# Patient Record
Sex: Male | Born: 1955 | Race: White
Health system: Southern US, Community
[De-identification: ages and names within clinical notes are randomized; demographics above are authoritative.]

---

## 2011-12-15 LAB — CBC WITH DIFFERENTIAL/PLATELET
Basophil %: 0.2 %
Eosinophil %: 0.1 %
HCT: 44.8 % (ref 40.0–52.0)
HGB: 15.1 g/dL (ref 13.0–18.0)
Lymphocyte #: 1.5 10*3/uL (ref 1.0–3.6)
MCH: 29.1 pg (ref 26.0–34.0)
MCHC: 33.6 g/dL (ref 32.0–36.0)
Monocyte %: 9.3 %
Neutrophil #: 18.9 10*3/uL — ABNORMAL HIGH (ref 1.4–6.5)
Neutrophil %: 83.7 %
Platelet: 226 10*3/uL (ref 150–440)
RBC: 5.18 10*6/uL (ref 4.40–5.90)
RDW: 14.7 % — ABNORMAL HIGH (ref 11.5–14.5)

## 2011-12-15 LAB — COMPREHENSIVE METABOLIC PANEL
Albumin: 3.9 g/dL (ref 3.4–5.0)
Alkaline Phosphatase: 84 U/L (ref 50–136)
BUN: 11 mg/dL (ref 7–18)
Bilirubin,Total: 1 mg/dL (ref 0.2–1.0)
Chloride: 105 mmol/L (ref 98–107)
EGFR (African American): >60
EGFR (Non-African Amer.): >60
Glucose: 104 mg/dL — ABNORMAL HIGH (ref 65–99)
Osmolality: 277 (ref 275–301)
SGOT(AST): 19 U/L (ref 15–37)
SGPT (ALT): 23 U/L (ref 12–78)

## 2011-12-16 ENCOUNTER — Inpatient Hospital Stay: Payer: Self-pay | Admitting: Internal Medicine

## 2011-12-16 LAB — CBC WITH DIFFERENTIAL/PLATELET
Basophil #: 0.1 10*3/uL (ref 0.0–0.1)
Eosinophil #: 0 10*3/uL (ref 0.0–0.7)
Eosinophil %: 0 %
HGB: 14.6 g/dL (ref 13.0–18.0)
Lymphocyte %: 5.9 %
MCH: 28.9 pg (ref 26.0–34.0)
MCHC: 33.6 g/dL (ref 32.0–36.0)
Monocyte #: 0.8 x10 3/mm (ref 0.2–1.0)
Neutrophil %: 91 %
Platelet: 225 10*3/uL (ref 150–440)
RDW: 14.7 % — ABNORMAL HIGH (ref 11.5–14.5)

## 2011-12-17 LAB — CBC WITH DIFFERENTIAL/PLATELET
Basophil #: 0 10*3/uL (ref 0.0–0.1)
Eosinophil %: 0 %
Lymphocyte #: 1.5 10*3/uL (ref 1.0–3.6)
Lymphocyte %: 6.2 %
MCH: 29.4 pg (ref 26.0–34.0)
MCHC: 33.8 g/dL (ref 32.0–36.0)
Monocyte %: 4.2 %
Neutrophil %: 89.5 %
Platelet: 232 10*3/uL (ref 150–440)
RBC: 4.65 10*6/uL (ref 4.40–5.90)
RDW: 14.4 % (ref 11.5–14.5)

## 2011-12-17 LAB — BASIC METABOLIC PANEL
BUN: 21 mg/dL — ABNORMAL HIGH (ref 7–18)
Chloride: 105 mmol/L (ref 98–107)
Creatinine: 1.13 mg/dL (ref 0.60–1.30)
Osmolality: 291 (ref 275–301)
Potassium: 3.4 mmol/L — ABNORMAL LOW (ref 3.5–5.1)

## 2011-12-17 LAB — VANCOMYCIN, TROUGH: Vancomycin, Trough: 10 ug/mL (ref 10–20)

## 2011-12-20 LAB — CULTURE, BLOOD (SINGLE)

## 2013-12-25 IMAGING — CT CT NECK WITH CONTRAST
1 of 2 series · 9 of 14 positions shown, 12 images · non-contrast
Comparison: none

REASON FOR EXAM: large swelling, pain, L neck - fever
COMMENTS:

[Series 3: soft tissue · axial · 0.48mm/px · z∈[+156,+414]mm · 9 of 108 slices shown, 12 images]
[im 11/108  soft-tissue]
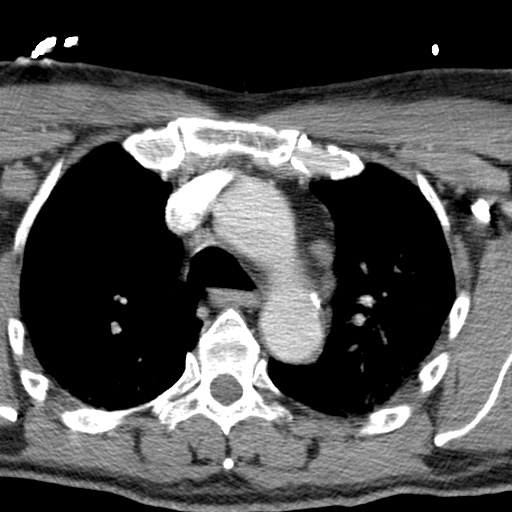
[im 11/108  bone]
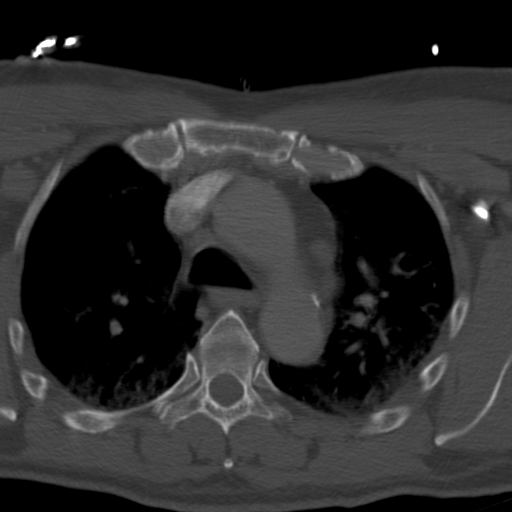
[im 22/108  bone]
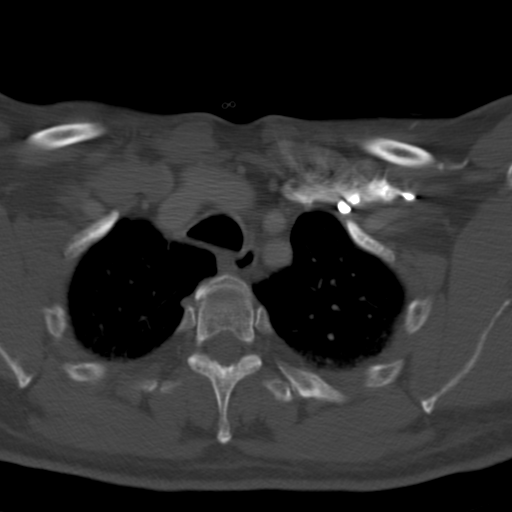
[im 33/108  bone]
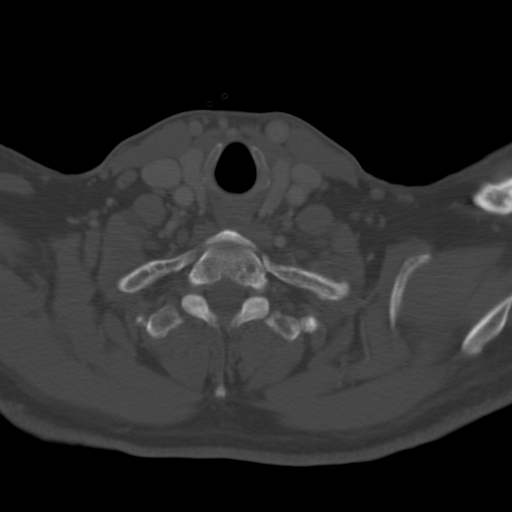
[im 43/108  bone]
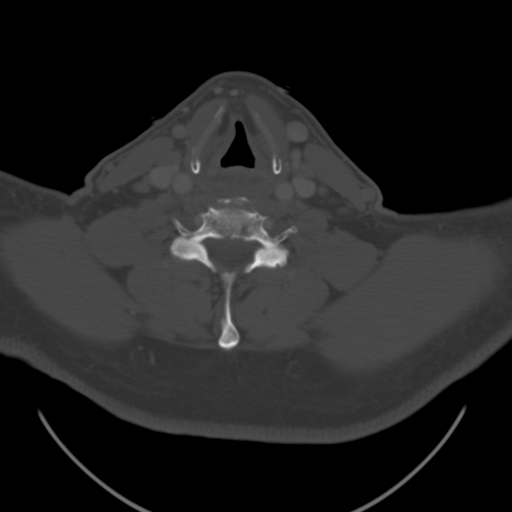
[im 54/108  soft-tissue]
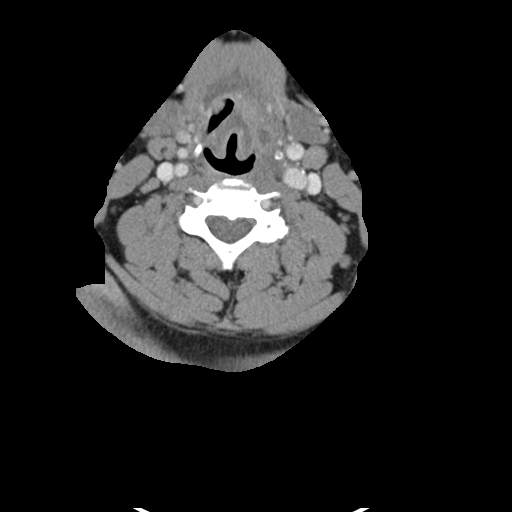
[im 54/108  bone]
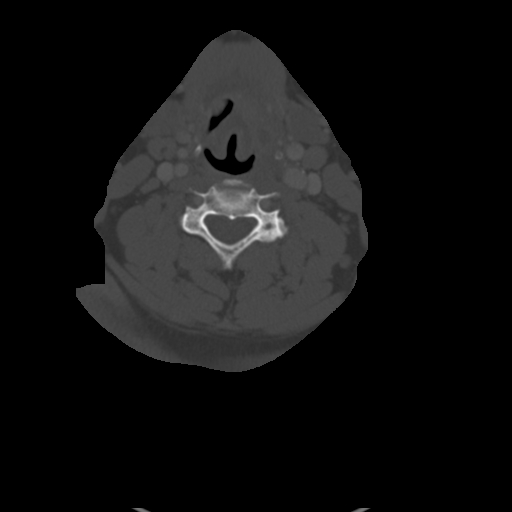
[im 65/108  bone]
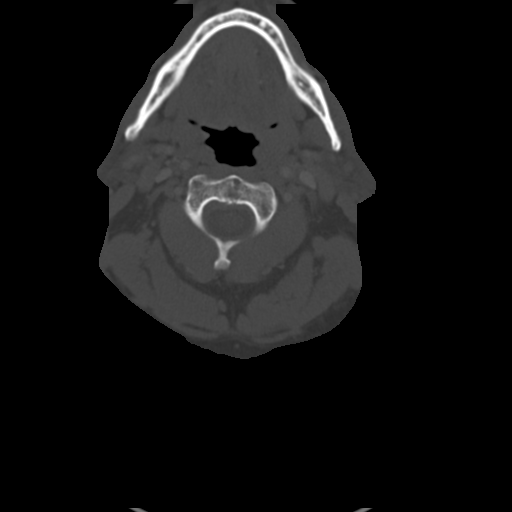
[im 75/108  bone]
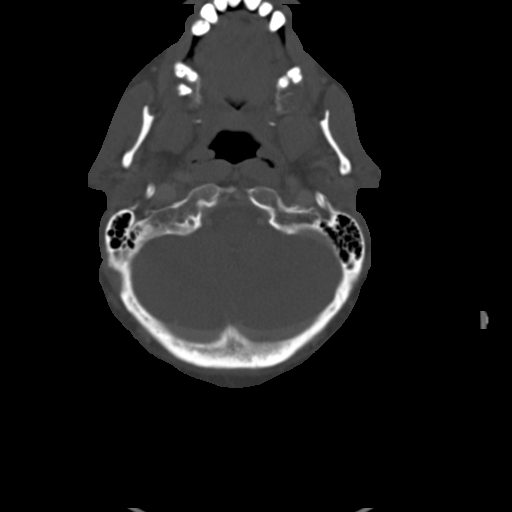
[im 86/108  bone]
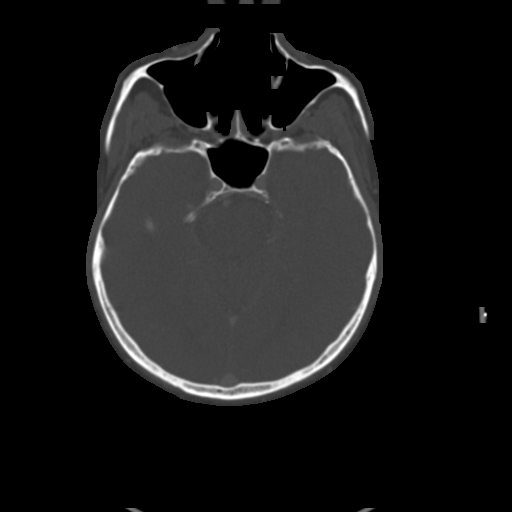
[im 97/108  soft-tissue]
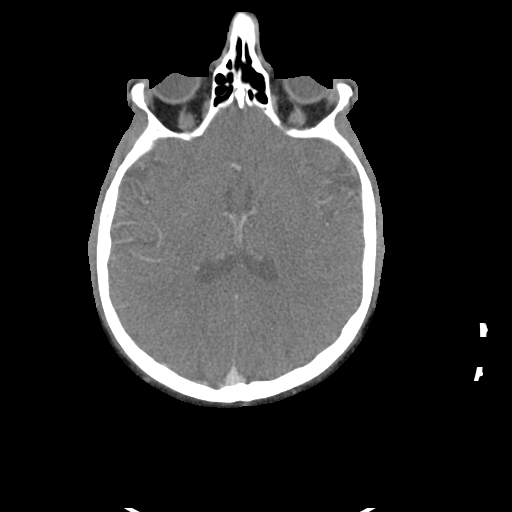
[im 97/108  bone]
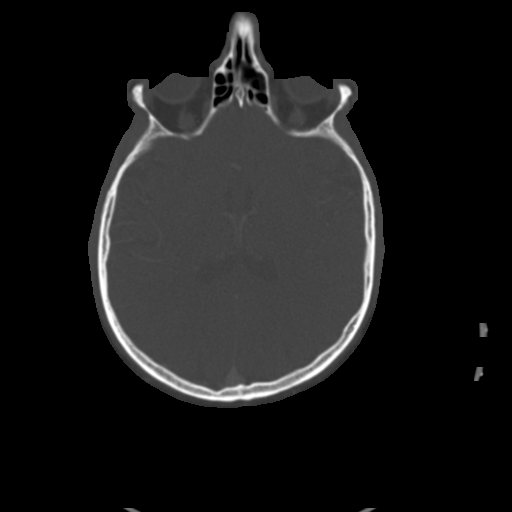

[9 of 14 positions shown; findings below may reference images not displayed]

PROCEDURE:     CT  - CT NECK WITH CONTRAST  - December 16, 2011 [DATE]

RESULT:     CT scanning was performed through the neck with reconstructions
in the axial plane at 3 mm intervals and slice thicknesses following
intravenous administration of 70 cc of Tsovue-TIF. Review of multiplanar
reconstructed images was performed separately on the VIA monitor.

The right and submandibular glands are normal in density and contour. There
are a few normal-sized subcutaneous lymph nodes demonstrated. There are
anterior and posterior cervical nodes measuring up to 1.2 cm in diameter.
Along the base of the tongue to the left of midline there is soft tissue
fullness . This partially effaces the vallecula. There is thickening of the
epiglottis. Inferior to this there is soft tissue fullness posteriorly and
to the right which deforms the airway and lies above the vocal cords. No
soft tissue gas collections are demonstrated. The jugular and carotid
vessels are normal in appearance.

The paranasal sinuses exhibit no air-fluid levels. The nasal passages are
patent. The thyroid lobes are normal in density and fairly symmetric in
size. The thyroid isthmus is mildly prominent measuring 1 cm in AP
dimension. The pulmonary apices exhibit emphysematous changes with small
bullous lesions. The cervical vertebral bodies are preserved in height. The
intravertebral disc space height cerebral reasonably well-maintained. The
prevertebral soft tissue spaces do not appear abnormal.
IMPRESSION: 1. There is thickening of the epiglottis and adjacent aryepiglottic folds
and of the left posterior tongue base. There is also soft tissue fullness
posteriorly and slightly to the right which deforms the airway. The findings
may be infectious or inflammatory or neoplastic though the latter is less
likely. No foreign body is demonstrated. No discrete drainable abscess is
demonstrated.
2. The nasopharyngeal structures and upper oropharyngeal structures appear
normal.
3. The parotid and submandibular glands exhibit no acute abnormality. There
are is no bulky cervical lymphadenopathy but there are borderline enlarged
anterior cervical nodes.

Following stabilization, ENT evaluation is recommended.

A preliminary report was sent to the [HOSPITAL] the conclusion
of the study.

## 2013-12-26 IMAGING — CR DG CHEST 1V PORT
1 series · 1 of 1 positions shown · non-contrast
Comparison: none

REASON FOR EXAM: crackles, hypoxia
COMMENTS:

[portable]
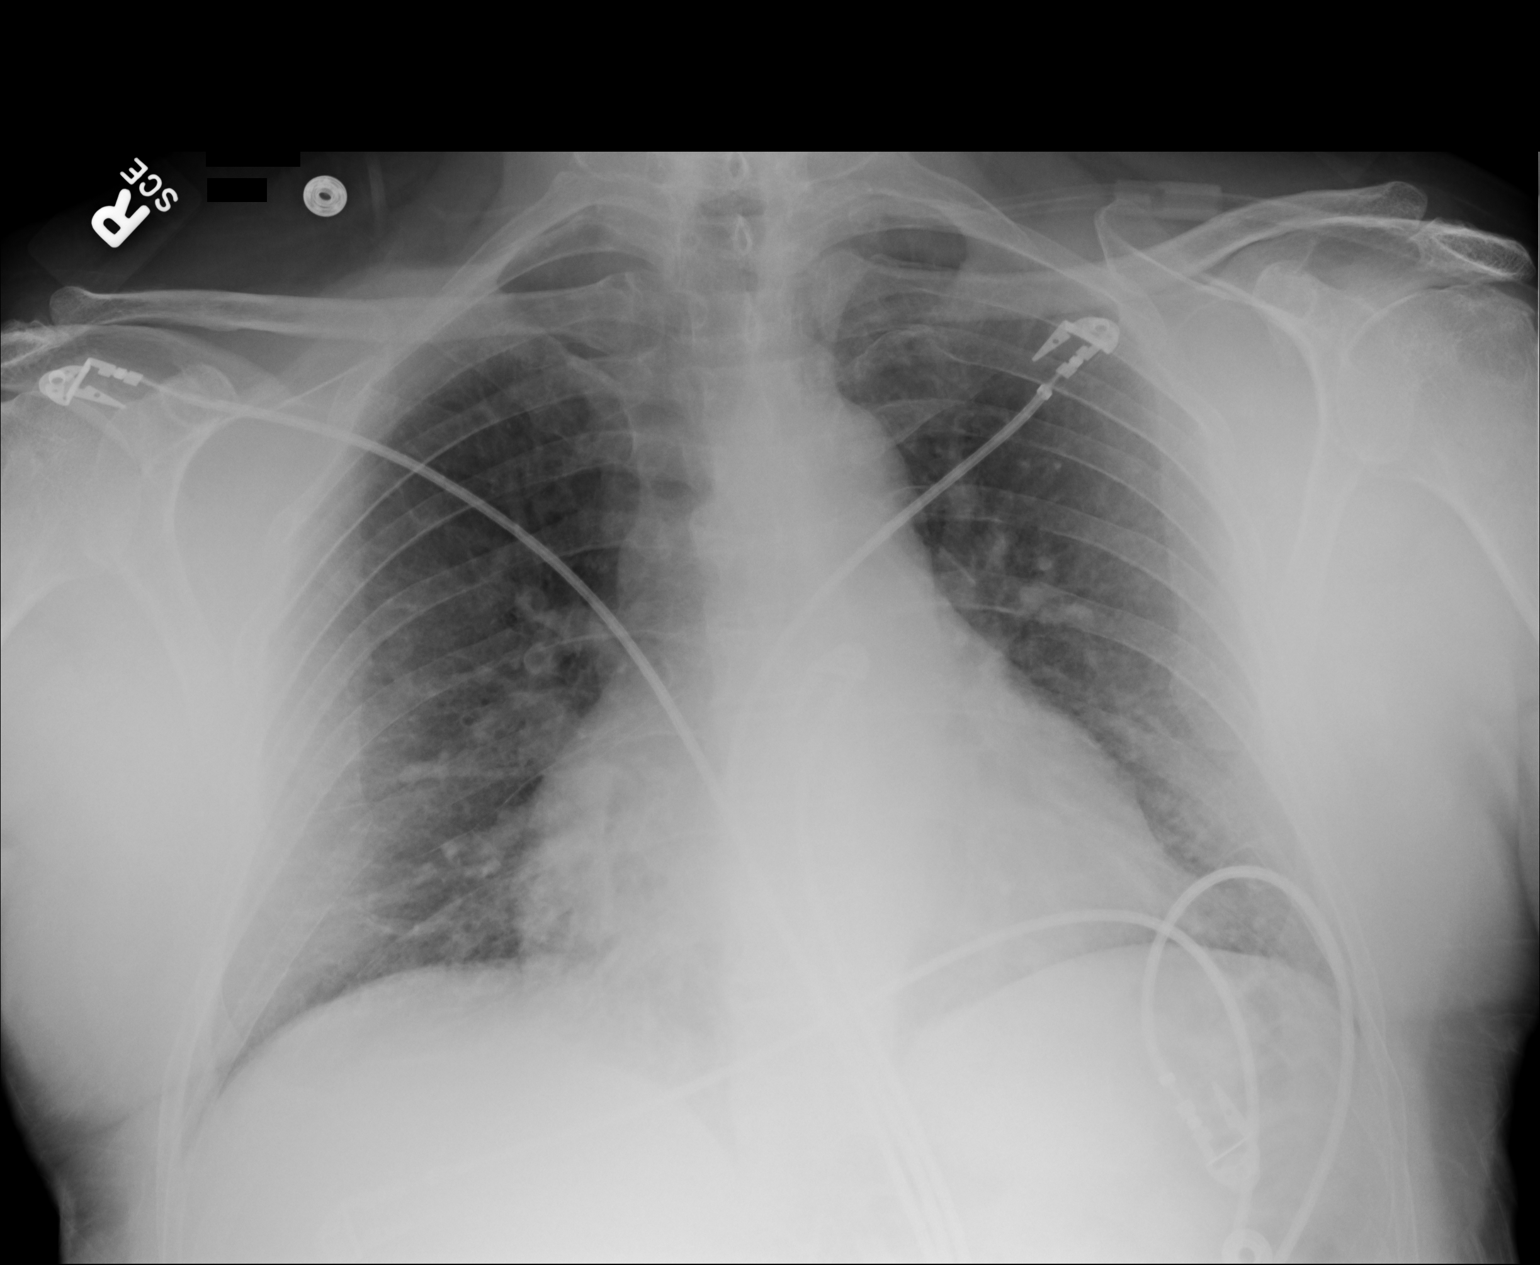

[1 of 1 positions shown; findings below may reference images not displayed]

PROCEDURE:     DXR - DXR PORTABLE CHEST SINGLE VIEW  - December 16, 2011 [DATE]

RESULT:     The patient has taken a shallow inspiration. There is mild
prominence of the interstitial markings. No focal regions of consolidation
are identified. The cardiac silhouette is moderately enlarged. The
visualized bony skeleton is unremarkable.
IMPRESSION: 1.     Shallow inspiration.
2.     Interstitial prominence which may represent a component of pulmonary
edema versus a nonedematous infectious or inflammatory infiltrate.
3.     Surveillance evaluation recommended if and as clinically warranted.

## 2014-08-23 NOTE — H&P (Signed)
PATIENT NAME:  Jerry SkenePARIS, Dinari MR#:  [id number] DATE OF BIRTH:  07/30/1955  DATE OF ADMISSION:  12/16/2011  PRIMARY CARE PHYSICIAN:  None. REFERRING PHYSICIAN: Dr. Manson PasseyBrown   CHIEF COMPLAINT: Fever, chills, sore throat, and dysphagia for three days.   HISTORY OF PRESENT ILLNESS:  59 year old male with no past medical history who presented to the ED with the above chief complaint.  The patient came from [place]; he is visiting his brother. He has been in the [place] for about one year. He started to have a sore throat, fever, and chills about three days ago. In addition, he could not swallow food and had muffled speech but that resolved later. The patient also has a cough and sputum but no shortness of breath or stridor. He denies any other symptoms.  CT scan of the neck showed epiglottitis.  He is being treated with Rocephin and vancomycin. The patient denies any recent travel history, but he mentioned that he suspected he had a tick bite six months ago with a black spot on his right thigh. The patient denies any low-grade fever, weight loss, or sweating recently. Denies any other ill contact.   PAST MEDICAL HISTORY: None.  FAMILY HISTORY: Hypertension in his sister.   PAST SURGICAL HISTORY: Hernia repair.   PERSONAL HISTORY: No smoking. Occasional alcohol. No drugs.  MEDICATIONS:  None.   ALLERGIES: None.  REVIEW OF SYSTEMS: CONSTITUTIONAL: The patient has a fever, chills. No headache or dizziness. No weakness. EYES: No double vision or blurred vision. ENT: No epistaxis or postnasal drainage but has a sore throat, dysphagia, and muffled speech. No drooling.  No discharge from ear or nose. No sinus pain. RESPIRATORY:  Positive for cough, sputum, but no wheezing, stridor, or hemoptysis. No history of tuberculosis. CARDIOVASCULAR: No chest pain, palpitations, orthopnea, or nocturnal dyspnea. No leg edema. GASTROINTESTINAL: No abdominal pain, nausea, vomiting, or diarrhea. No melena or bloody  stool. GENITOURINARY: No dysuria, hematuria, or incontinence. HEMATOLOGIC: No easy bruising or bleeding.  ENDOCRINE: No polyuria, polydipsia, or heat or cold intolerance. NEUROLOGIC: No syncope, loss of consciousness or seizure. PSYCH: No anxiety or depression.   PHYSICAL EXAMINATION:  VITAL SIGNS: Temperature 99.2, temperature maximal was 102.9, blood pressure 140/76, pulse 101. It was 112. Respirations 19, oxygen saturation 93% on oxygen.   GENERAL: The patient is alert, awake, oriented, in no acute distress.   HEENT: Pupils round, equal, reactive to light and accommodation. Moist oral mucosa. Swelling of the oropharynx but no erythema.  No drooling.  NECK: Supple. No JVD or carotid bruits. No lymphadenopathy. No thyromegaly, but has tenderness on the left side of the neck with mild swelling. No stridor. No wheezing.   CARDIOVASCULAR: S1, S2, regular rate and rhythm. No murmurs or gallops.   PULMONARY: Bilateral air entry. No wheezing or rales. No stridor. No use of accessory muscles to breathe.   ABDOMEN: Soft. No distention or tenderness. No organomegaly. Bowel sounds present.   EXTREMITIES: No edema, clubbing, or cyanosis. No calf tenderness. Strong bilateral pedal pulses.   SKIN: No rash or jaundice.   NEUROLOGY: Alert and oriented times three. No focal deficit. Power five out of five. Sensation intact. Deep tendon reflexes 2+.   LABORATORY, DIAGNOSTIC, AND RADIOLOGICAL DATA: WBC 20.5, hemoglobin 15.1, platelets 226, glucose 108, BUN 11, creatinine 0.88. Electrolytes are normal. CT scan of soft tissue of the neck with IV contrast showed thickened epiglottis and aryepiglottic folds compatible with epiglottitis.    IMPRESSION:  1. Acute epiglottitis. 2. SIRS.  PLAN OF TREATMENT:  1. The patient will be admitted to Critical Care Unit.  We will closely monitor airway and vital signs.  Keep n.p.o. now, give IV fluid support. We continue Rocephin and vancomycin. Follow up CBC, blood  culture, and CRP.  2. We will get ENT consult.  3. The patient came from [place].  He has a language barrier and does not speak [place].  All the information gathered by translation from his brother. Discussed the patient's situation and the plan of treatment with the patient and his brother.   TIME SPENT: About 65 minutes.   ____________________________ Shaune Pollack, MD qc:bjt D: 12/16/2011 01:55:58 ET T: 12/16/2011 08:55:49 ET JOB#: [id number]  cc: Shaune Pollack, MD, <Dictator> Shaune Pollack MD ELECTRONICALLY SIGNED 12/17/2011 15:16

## 2014-08-23 NOTE — Op Note (Signed)
PATIENT NAME:  Jerry Torres, Jerry Torres MR#:  [id number] DATE OF BIRTH:  [birth year]  DATE OF PROCEDURE:  12/17/2011  PREOPERATIVE DIAGNOSIS: Epiglottitis.  POSTOPERATIVE DIAGNOSIS:  Epiglottitis.   PROCEDURE:  Flexible fiberoptic nasal laryngoscopy.  SURGEON:  Marion DownerScott Patrik Turnbaugh, MD   INDICATIONS: This is followup on a patient with epiglottitis to assess his airway for improvement. He was able to swallow and eat last night and is feeling better. He says that his sore throat is at least 40% improved. The patient has been afebrile for the past 24 hours.   FINDINGS:  There is still a little thickening in the epiglottitis but the airway is markedly improved and all of the edema of the aryepiglottic fold and arytenoids is essentially resolved. Vocal cords were clear and mobile.  There is some fullness of the lingual tonsillar area notable, but improved from yesterday. There is no airway compromise whatsoever at this point.   DESCRIPTION OF PROCEDURE: After discussing the procedure with the patient, a flexible scope was passed through the right nasal cavity. Nasal cavity, nasopharynx, hypopharynx, larynx, and tongue base were inspected with the above findings. The patient tolerated the procedure well. The scope was removed.   ASSESSMENT: [name] appears to be doing much better at this point and his airway is no longer compromised by edema. There is some mild thickening of the epiglottis at this point which should continue to resolve with antibiotic therapy.   PLAN:  I think the patient is safe to be transferred to the floor from an airway standpoint. When he is ultimately discharged, coverage such as Ceftin or Augmentin could be considered and would also consider sending him home on  60-mg prednisone taper. The one outstanding issue is the patient does have some evidence of respiratory failure requiring oxygen with possible pulmonary edema, so the primary service is proceeding with further work-up of that  situation.    From an ENT standpoint he can be discharged at any time, however. I will sign off.  Feel free to reconsult if necessary.    ____________________________ Jerry GrossPaul S. Willeen CassBennett, MD psb:bjt D: 12/17/2011 08:38:22 ET T: 12/17/2011 11:17:01 ET JOB#: [id number]  cc: Jerry GrossPaul S. Willeen CassBennett, MD, <Dictator> Sandi MealyPAUL S Aviannah Castoro MD ELECTRONICALLY SIGNED 12/18/2011 7:40

## 2014-08-23 NOTE — Discharge Summary (Signed)
PATIENT NAME:  Jerry Torres, Jerry Torres MR#:  [id number] DATE OF BIRTH:  [birth year]  DATE OF ADMISSION:  12/16/2011 DATE OF DISCHARGE:  12/17/2011  ADMITTING DIAGNOSIS: Systemic inflammatory response reaction.   DISCHARGE DIAGNOSES:  1. Systemic inflammatory response reaction due to bacterial acute epiglottitis.  2. Hypoxia.  3. Chronic obstructive pulmonary disease.  4. Ongoing tobacco abuse.  5. Hypertension.  6. Hyperglycemia. Hemoglobin A1c 6.4 concerning for diabetes.   DISCHARGE CONDITION: Stable.   DISCHARGE MEDICATIONS: The patient is to start: 1. Lisinopril 5 mg p.o. daily.  2. Symbicort 160/4.5, two puffs twice daily. 3. Combivent Respimat CFC one puff 4 times daily. 4. Nicotine patch 21 mg topically daily.  5. Nicotine oral inhaler one cartridge inhalation every one hour as needed.  6. Prednisone 50 mg p.o. once on 12/18/2011, then taper by 10 mg daily until stopped.  7. Ceftin 500 mg p.o. twice daily for 10 days.   HOME OXYGEN: None.   DIET: 2 grams salt, carbohydrate controlled diet, mechanical soft, advance to regular as tolerated.   FOLLOWUP: Follow-up appointment with Dr. Willeen Cass in two days after discharge.   DISCHARGE INSTRUCTIONS: The patient was advised to stop smoking as soon as possible.    CONSULTANTS: Dr. Willeen Cass.   PROCEDURE: Flexible fiberoptic nasal laryngoscopy on 12/16/2011. Repeated flexible fiberoptic nasal laryngoscopy again on 12/16/2011. Repeated flexible fiberoptic nasal laryngoscopy on 12/17/2011.   RADIOLOGIC STUDIES: Chest x-ray, portable single view 12/16/2011 revealed shallow inspiration interstitial prominence which may represent a competent of pulmonary edema versus nonedematous infectious or inflammatory infiltrate. Surveillance evaluation is recommended as clinically warranted. CT scan of neck with contrast 12/16/2011 revealed thickening of epiglottis and adjacent aryepiglottic folds of the left posterior tongue base. There is also soft tissue  fullness posteriorly and slightly to the right which deforms the airway. The findings may be infectious or inflammatory or neoplastic, though the latter is less likely. No foreign body is demonstrated. No discrete drainable abscess is demonstrated. The nasopharyngeal structures and upper oropharyngeal structures appear normal. The parotid gland and submandibular glands exhibit no acute abnormality. There is no bulky cervical lymphadenopathy but there are borderline enlarged anterior cervical nodes. CT of chest for pulmonary embolism with IV contrast 12/17/2011 showed no CT evidence of pulmonary embolus. There are severe bilateral emphysematous changes. Lungs otherwise were clear.   HISTORY: The patient is a 59 year old Caucasian male from [place] who presented to the hospital with complaints of fever, chills, sore throat as well as dysphagia for three days prior to coming to the hospital. Please refer to Dr. Nicky Pugh admission note on 12/16/2011.   PHYSICAL EXAMINATION:  On arrival to the hospital, the patient's temperature was 99.2, temperature maximum was 102.9, blood pressure 140/76, pulse 112, respiration rate 19. Oxygen saturation was 93% on oxygen therapy. Physical exam showed mild tenderness of the left side of neck. Mild swelling on palpation, however, no stridor, wheezing, were noted on lung exam. His physical examination was otherwise unremarkable.   LABORATORY DATA: Lab data showed elevated glucose to 104, otherwise BMP was unremarkable. The patient's liver enzymes were normal. The patient's white blood cell count was markedly elevated to 22.5. Hemoglobin 15.1, platelet count 226. Absolute neutrophil count was high at 18.9. Blood cultures x2 did not show any growth taken on 12/16/2011. C-reactive protein was high at 137.6   HOSPITAL COURSE: The patient was evaluated in the Emergency Room and consultation with Dr. Willeen Cass was obtained. Dr. Willeen Cass saw the patient in consultation the same day,  12/16/2011, at 3  a.m. because of odynophagia as well as dysphagia, possible epiglottitis. The patient underwent flexible fiberoptic nasal laryngoscopy which initially revealed erythema as well as edema of the epiglottis extending around the area of epiglottic fold on both sides, a little more prominent on the right than the left where the arytenoid region is a little bit more swollen. Vocal cords were visualized and they were clear and mobile. Tongue base as well as hypopharynx were otherwise unremarkable. The patient was started on antibiotics IV with Rocephin as well as vancomycin. He was started on steroids IV and he was rechecked again in the next nine hours with flexible fiberoptic nasal laryngoscopy. It showed modest improvement from condition last night on the second evaluation although airway remains swollen. Dr. Willeen Torres felt that the patient is best to be kept in Critical Care Unit until at least 12/17/2011. The patient was scoped again on 12/17/2011. His progress was observed and it was felt that there was so little thickening of epiglottis that airway was markedly improved and all the edema in the fold aryepiglottic fold as well as arytenoids was essentially resolved. Vocal cords were clear and mobile. There was some fullness in the lingular tonsillar area notable, but improved from yesterday and there was no airway compromise whatsoever at this point and Dr. Willeen Torres felt that it was safe to transfer the patient to the floor from airways standpoint. He recommended to cover him with Ceftin or Augmentin and also prednisone taper. The patient was followed and it was felt to be safe to be discharged home in regards to acute epiglottitis. However, upon further evaluation, it appeared that the patient still has with improvement of his epiglottis, still has significant hypoxia. The patient's chest x-ray was unremarkable, however, he underwent a CT scan of his chest, which showed severe emphysema, likely due to  prolonged smoking history. The patient was started on Symbicort as well as Combivent. He was advised to quit smoking and nicotine replacement therapy was initiated. The patient was ambulated on room air in the hospital and his oxygen saturation did not drop down below 92% on room air on ambulation   The patient was noted to be hypertensive with systolic blood pressure shooting up to 150s to 160s and lisinopril was initiated. The patient is to follow up with his primary care physician in regards to chronic obstructive pulmonary disease management as well as his hypertension management.   The patient was noted to be hyperglycemic. His Hemoglobin A1c was checked and was found to be 6.4. Because of concern for diabetes, the patient was initiated on carbohydrate-controlled diet and diabetic education was consulted. The patient was advised also to lose weight if possible and to prevent risk of diabetes. The patient is being discharged in stable condition with the above-mentioned medications and follow-up. His vital signs on the day of discharge, temperature 98, pulse 84, respiration rate 16, blood pressure 153/73, saturation 94% on room air at rest.        TIME SPENT: 40 minutes.   ____________________________ Jerry Caperima Jonan Seufert, MD rv:ap D: 12/17/2011 19:11:29 ET T: 12/19/2011 10:55:34 ET JOB#: [id number]  cc: Jerry Caperima Ghina Bittinger, MD, <Dictator> Jerry GrossPaul S. Willeen CassBennett, MD Jerry CaperIMA Terena Bohan MD ELECTRONICALLY SIGNED 12/19/2011 14:49

## 2014-08-23 NOTE — Consult Note (Signed)
PATIENT NAME:  Jerry Torres, Jerry Torres MR#:  [id number] DATE OF BIRTH:  16-Jul-1955  DATE OF CONSULTATION:  12/16/2011  REFERRING PHYSICIAN:  Dr. Imogene Burnhen CONSULTING PHYSICIAN:  Ollen GrossPaul S. Willeen CassBennett, MD  REASON FOR CONSULTATION: Epiglottitis.   HISTORY OF PRESENT ILLNESS:  59 year old male who has been sick with sore throat since Friday, which worsened today to the point he was having difficulty swallowing, fever, and some difficulty breathing. He was brought to the Emergency Room and since then was given antibiotics and steroids and is now no longer having difficulty breathing. His sore throat is improved and it feels easier to swallow.  He was admitted with presumed diagnosis of epiglottitis. White count was found to be elevated at 22.5.   PAST MEDICAL HISTORY:  Otherwise unremarkable. No history of hypertension, heart disease, or asthma.   PAST SURGICAL HISTORY: None.   MEDICATIONS: None.   ALLERGIES: None.   SOCIAL HISTORY: The patient has smoked a pack a day for 30 years. He only drinks alcohol socially. He works as a Technical sales engineermusician.   REVIEW OF SYSTEMS: The patient has not had any nausea, vomiting, or diarrhea. He did have fever and chills. He also had pain in the left ear radiating down to his throat. He has not had any significant hoarseness although speech was apparently a little muffled.   PHYSICAL EXAMINATION:  VITAL SIGNS: Temperature 99.2, pulse 101, blood pressure is 140/76, respirations 19, oxygen saturation 93% on room air on admission to the hospital, although currently 100% on nasal cannula oxygen.   GENERAL: Well-developed, well-nourished male in no acute distress, lying comfortably in bed. There is no stridor.   HEAD AND FACE: Head is normocephalic, atraumatic. No facial skin lesions. Facial strength is normal and symmetric.   EARS: External ears, ear canals, and tympanic membranes are clear bilaterally. There is no middle ear effusion or infection.   NOSE: External nose unremarkable. Nasal  cavity is clear. Septum deviates minimally towards the right. No purulence is seen. There are no polyps.  ORAL CAVITY AND OROPHARYNX: Teeth, lips, and gums unremarkable. Tongue and floor of the mouth without lesions. Posterior pharynx is clear without erythema or exudate or edema. The uvula is midline.   NECK: Neck is supple without palpable lymphadenopathy. He is a little tender in the left neck minimally to palpation, but there is no fluctuance. Salivary glands are soft and nontender without masses. There is no thyromegaly.   NEUROLOGIC: Cranial nerves II through XII are grossly intact.   Fiberoptic nasal laryngoscopy was performed today. The hypopharynx, larynx, and tongue base were carefully inspected. He does have evidence of epiglottitis with erythema and edema of the epiglottis that extends around involving the right and left aryepiglottic fold, a little more prominent on the right than the left. Vocal cords are clear and mobile, however. Though there is some  compromise of the supraglottic airway, he does indeed have an intact airway at this point, certainly not a critical airway at this point.   ASSESSMENT: This patient does indeed appear to have epiglottitis.   PLAN: Currently he is covered with both Rocephin and vancomycin, which should be more than adequate antibiotic coverage. He did receive Decadron in the Emergency Room. I would continue the Decadron q. 6 hours and I will reassess him in the next 8 to 10 hours four improvement of his airway. Agree with careful monitoring in the Critical Care Unit for now until reassessed. He is here with a family member and I explained that he should  let the nursing staff know if he has any return of his difficulty breathing to any significant degree. I do not think he needs intubation or tracheostomy at this point given his improvement from earlier today.    However, I did explain to the patient and his family that it was possible that intubation or  tracheostomy might be considered if his condition were to deteriorate again.     ____________________________ Ollen Gross. Willeen Cass, MD psb:bjt D: 12/16/2011 03:25:17 ET T: 12/16/2011 12:18:54 ET JOB#: [id number]  cc: Ollen Gross. Willeen Cass, MD, <Dictator> Sandi Mealy MD ELECTRONICALLY SIGNED 12/18/2011 7:40

## 2014-08-23 NOTE — Op Note (Signed)
PATIENT NAME:  Jerry SkenePARIS, Tuff MR#:  [id number] DATE OF BIRTH:  02-Dec-1955  DATE OF PROCEDURE:  12/16/2011  PREOPERATIVE DIAGNOSIS: Epiglottitis.   POSTOPERATIVE DIAGNOSIS: Epiglottitis.   PROCEDURE: Flexible fiberoptic nasal laryngoscopy.   SURGEON: Havery MorosP. Scott Rubert Frediani, MD   INDICATIONS: I saw this patient eight hours ago for new admission for epiglottitis. Fiberoptic nasal laryngoscopy was performed this afternoon to evaluate for improvement. He is on antibiotics and Decadron. He says his throat is still sore. It's still sore to swallow but he's not having any difficulty breathing. He is having some baseline oxygen desaturations around 89 to 90% but there is no stridor.   FINDINGS: The epiglottis is still erythematous and swollen with edema extending down the area of epiglottic fold with more edema noted on the right arytenoid. Vocal cords are clear. There is some scant yellow mucus noted in the region around the larynx. Overall the edema is slightly improved from last night, however.   DESCRIPTION OF PROCEDURE: After discussing the procedure with the patient, the scope was passed carefully through the right nasal cavity and into the nasopharynx. The hypopharynx, larynx, and tongue base were inspected with the above findings. He was instructed to phonate to better assess vocal cord motion which was normal. The scope was withdrawn. The patient tolerated the procedure well.   ASSESSMENT: Mr. [name] appears to have had some modest improvement from his condition last night, although the airway still remains swollen. He did have a CT scan performed earlier. This showed thickening of the glottis and aryepiglottic folds and of the left posterior base of tongue which appeared to be most likely inflammatory. There was no bulky cervical lymphadenopathy. This is all consistent with my exam findings. Given the continued airway swelling, I think it is best to keep the patient in the CCU until tomorrow. I will  reassess tomorrow with fiberoptic nasal laryngoscopy again and make sure he is making progress. Nursing staff is going to contact the primary service to discuss whether the baseline oxygenation might need further evaluation such as a chest x-ray. She thought she had heard some rales intermittently in the lungs. Clearly he is moving air quite well. There is no stridor whatsoever so I can't explain any oxygen desaturations on the basis of the laryngeal swelling he is experiencing. He is resting comfortably in bed in a supine position, not having any trouble with upper airway obstruction currently despite the noted edema and he is slightly improved from last evening. Recommend continuing IV antibiotics and Decadron and I'll see him tomorrow.   ____________________________ Ollen GrossPaul S. Willeen CassBennett, MD psb:drc D: 12/16/2011 12:35:29 ET T: 12/16/2011 12:57:18 ET JOB#: [id number]  cc: Ollen GrossPaul S. Willeen CassBennett, MD, <Dictator> Sandi MealyPAUL S Kaily Wragg MD ELECTRONICALLY SIGNED 12/18/2011 7:40

## 2014-08-23 NOTE — Op Note (Signed)
PATIENT NAME:  Jerry Torres, Jerry Torres MR#:  [id number] DATE OF BIRTH:  June 02, 1955  DATE OF PROCEDURE:  12/16/2011  PREOPERATIVE DIAGNOSIS: Odynophagia and dysphagia with possible epiglottitis.    POSTOPERATIVE DIAGNOSIS: Odynophagia and dysphagia with possible epiglottitis.   PROCEDURE: Flexible fiberoptic nasal laryngoscopy.   SURGEON: Havery MorosP. Scott Asako Saliba, MD   DESCRIPTION OF PROCEDURE: After discussing the procedure with the patient, the flexible fiberoptic scope was passed through the left nasal cavity. The nasopharynx was found to be clear. There was no purulence in the nasal cavity. The hypopharynx, larynx, and tongue base were carefully inspected. The patient has erythema and edema of the epiglottis, extends around the area of the epiglottic fold on both sides, a little more prominent on the right than the left where the arytenoid region is a bit more swollen. Vocal cords were visualized and were clear and mobile. Tongue base and hypopharynx are otherwise unremarkable. Scope was withdrawn. The patient tolerated the procedure well.   ____________________________ Ollen GrossPaul S. Willeen CassBennett, MD psb:drc D: 12/16/2011 03:26:50 ET T: 12/16/2011 12:13:54 ET JOB#: [id number]  cc: Ollen GrossPaul S. Willeen CassBennett, MD, <Dictator> Sandi MealyPAUL S Lasheika Ortloff MD ELECTRONICALLY SIGNED 12/18/2011 7:40

## 2014-08-23 NOTE — Consult Note (Signed)
Chief Complaint:   Subjective/Chief Complaint Sore throat persists. no breathing difficulty   VITAL SIGNS/ANCILLARY NOTES: **Vital Signs.:   12-Aug-13 12:00   Pulse Pulse 64   Respirations Respirations 15   Systolic BP Systolic BP 474   Diastolic BP (mmHg) Diastolic BP (mmHg) 69   Mean BP 94   Pulse Ox % Pulse Ox % 92   Pulse Ox Activity Level  At rest   Oxygen Delivery 3L; Nasal Cannula   Pulse Ox Heart Rate 66   Brief Assessment:   Respiratory normal resp effort  no use of accessory muscles  No stridor. Free air movement noted in supine position    Additional Physical Exam Fiberoptic laryngoscopy shows edema of epiglottis, aryepiglottic folds and right ayrtenoid region with erythema, though slightly improved from prior exam   Lab Results: Hepatic:  11-Aug-13 22:35    Bilirubin, Total 1.0   Alkaline Phosphatase 84   SGPT (ALT) 23   SGOT (AST) 19   Total Protein, Serum 7.8   Albumin, Serum 3.9  Routine Chem:  11-Aug-13 22:35    Glucose, Serum  104   BUN 11   Creatinine (comp) 0.88   Sodium, Serum 139   Potassium, Serum 3.6   Chloride, Serum 105   CO2, Serum 27   Calcium (Total), Serum 8.6   Osmolality (calc) 277   eGFR (African American) >60   eGFR (Non-African American) >60 (eGFR values <30m/min/1.73 m2 may be an indication of chronic kidney disease (CKD). Calculated eGFR is useful in patients with stable renal function. The eGFR calculation will not be reliable in acutely ill patients when serum creatinine is changing rapidly. It is not useful in  patients on dialysis. The eGFR calculation may not be applicable to patients at the low and high extremes of body sizes, pregnant women, and vegetarians.)   Anion Gap 7  Routine Hem:  11-Aug-13 22:35    WBC (CBC)  22.5   RBC (CBC) 5.18   Hemoglobin (CBC) 15.1   Hematocrit (CBC) 44.8   Platelet Count (CBC) 226   MCV 87   MCH 29.1   MCHC 33.6   RDW  14.7   Neutrophil % 83.7   Lymphocyte % 6.7   Monocyte %  9.3   Eosinophil % 0.1   Basophil % 0.2   Neutrophil #  18.9   Lymphocyte # 1.5   Monocyte #  2.1   Eosinophil # 0.0   Basophil # 0.0 (Result(s) reported on 15 Dec 2011 at 11:02PM.)  12-Aug-13 07:19    WBC (CBC)  26.1   RBC (CBC) 5.07   Hemoglobin (CBC) 14.6   Hematocrit (CBC) 43.5   Platelet Count (CBC) 225   MCV 86   MCH 28.9   MCHC 33.6   RDW  14.7   Neutrophil % 91.0   Lymphocyte % 5.9   Monocyte % 2.9   Eosinophil % 0.0   Basophil % 0.2   Neutrophil #  23.8   Lymphocyte # 1.5   Monocyte # 0.8   Eosinophil # 0.0   Basophil # 0.1 (Result(s) reported on 16 Dec 2011 at 07:52AM.)   Radiology Results: CT:    12-Aug-13 00:00, CT Neck With Contrast   CT Neck With Contrast    REASON FOR EXAM:    large swelling, pain, L neck - fever  COMMENTS:       PROCEDURE: CT  - CT NECK WITH CONTRAST  - Dec 16 2011 12:00AM  RESULT: CT scanning was performed through the neck with reconstructions   in the axial plane at 3 mm intervalsand slice thicknesses following   intravenous administration of 70 cc of Isovue-370. Review of multiplanar   reconstructed images was performed separately on the VIA monitor.    The right and submandibular glands are normal in density and contour.   There are a few normal-sized subcutaneous lymph nodes demonstrated. There   are anterior and posterior cervical nodes measuring up to 1.2 cm in   diameter. Along the base of the tongue to the left of midline there is   soft tissue fullness . This partially effaces the vallecula. There is     thickening of the epiglottis. Inferior to this there is soft tissue   fullness posteriorly and to the right which deforms the airway and lies   above the vocal cords. No soft tissue gas collections are demonstrated.   The jugular and carotid vessels are normal in appearance.    The paranasal sinuses exhibit no air-fluid levels. The nasal passages are   patent. The thyroid lobes are normal in density and fairly  symmetric in   size. The thyroid isthmus is mildly prominent measuring 1 cm in AP   dimension. The pulmonary apices exhibit emphysematous changes with small   bullous lesions. The cervical vertebral bodies are preserved in height.   The intravertebral disc space height cerebral reasonably well-maintained.   The prevertebral soft tissue spaces do not appear abnormal.    IMPRESSION:    1. There is thickening of the epiglottis and adjacent aryepiglottic folds     and of the left posterior tongue base. There is also soft tissue fullness   posteriorly and slightly to the right which deforms the airway. The   findings may be infectious or inflammatory or neoplastic though the   latter is less likely. No foreign body is demonstrated. No discrete   drainable abscess is demonstrated.  2. The nasopharyngeal structures and upper oropharyngeal structures   appear normal.  3. The parotid and submandibular glands exhibit no acute abnormality.   There are is no bulky cervical lymphadenopathy but there are borderline   enlarged anterior cervical nodes.    Following stabilization, ENT evaluation is recommended.    A preliminary report was sent to the emergency department at the   conclusion of the study.    Verified By: DAVID A. Martinique, M.D., MD   Assessment/Plan:  Invasive Device Daily Assessment of Necessity:   Does the patient currently have any of the following indwelling devices? none   Assessment/Plan:   Assessment slow improvement in epiglottitis. WBC still up but hard to interpret given steroids. CT shows no abscess and id consistent with physical findings. Modest improvement in edema. Airway mildly compromised but still stable with no need for intubation or surgical airway    Plan Continue antibiotics and steroids. Contimnue CCU care for now. Well reassess airway tomorrow AM. Reasonable for primary service to evaluate cause of baseline hypoxemia. Upper airway would not be causing this.  Consider CXR   Electronic Signatures: Riley Nearing (MD)  (Signed 12-Aug-13 12:41)  Authored: Chief Complaint, VITAL SIGNS/ANCILLARY NOTES, Brief Assessment, Lab Results, Radiology Results, Assessment/Plan   Last Updated: 12-Aug-13 12:41 by Riley Nearing (MD)

## 2014-08-23 NOTE — Consult Note (Signed)
Patient doing much better. Was able to eat last night. Scope exam shows marked improvement in swelling. Fine from ENT standpoint to transfer to floor. I will leave decision to d/c home up to primary service. He could go home on Augmentin or Ceftin and a 60 mg prednisone taper. However it also appears there are some questions about pulmonary status to be addressed by medicine given CXR findings. Will sign off at this point.  Electronic Signatures: Sandi MealyBennett, Sailor Hevia S (MD)  (Signed on 13-Aug-13 08:34)  Authored  Last Updated: 13-Aug-13 08:34 by Sandi MealyBennett, Magdalena Skilton S (MD)
